# Patient Record
Sex: Male | Born: 1985 | Race: White | Hispanic: No | Marital: Single | Smoking: Former smoker
Health system: Southern US, Community
[De-identification: ages and names within clinical notes are randomized; demographics above are authoritative.]

---

## 2007-02-14 ENCOUNTER — Emergency Department (HOSPITAL_COMMUNITY): Admission: EM | Admit: 2007-02-14 | Discharge: 2007-02-14 | Payer: Self-pay | Admitting: Emergency Medicine

## 2018-05-26 ENCOUNTER — Ambulatory Visit (HOSPITAL_COMMUNITY): Payer: Self-pay | Admitting: Licensed Clinical Social Worker

## 2018-09-07 ENCOUNTER — Telehealth: Payer: Self-pay | Admitting: Family

## 2018-09-07 DIAGNOSIS — R6889 Other general symptoms and signs: Principal | ICD-10-CM

## 2018-09-07 DIAGNOSIS — Z20822 Contact with and (suspected) exposure to covid-19: Secondary | ICD-10-CM

## 2018-09-07 MED ORDER — BENZONATATE 100 MG PO CAPS: 100.0000 mg | ORAL_CAPSULE | Freq: Three times a day (TID) | ORAL | 0 refills | Status: DC | PRN

## 2018-09-07 NOTE — Progress Notes (Signed)

## 2019-08-28 ENCOUNTER — Other Ambulatory Visit: Payer: Self-pay

## 2019-08-28 ENCOUNTER — Emergency Department (HOSPITAL_COMMUNITY): Payer: No Typology Code available for payment source

## 2019-08-28 ENCOUNTER — Emergency Department (HOSPITAL_COMMUNITY)
Admission: EM | Admit: 2019-08-28 | Discharge: 2019-08-28 | Disposition: A | Discharge status: Home / Self Care | Payer: No Typology Code available for payment source | Attending: Emergency Medicine | Admitting: Emergency Medicine

## 2019-08-28 DIAGNOSIS — Y939 Activity, unspecified: Secondary | ICD-10-CM | POA: Diagnosis not present

## 2019-08-28 DIAGNOSIS — Y929 Unspecified place or not applicable: Secondary | ICD-10-CM | POA: Insufficient documentation

## 2019-08-28 DIAGNOSIS — R519 Headache, unspecified: Secondary | ICD-10-CM | POA: Diagnosis present

## 2019-08-28 DIAGNOSIS — Y999 Unspecified external cause status: Secondary | ICD-10-CM | POA: Insufficient documentation

## 2019-08-28 DIAGNOSIS — M542 Cervicalgia: Secondary | ICD-10-CM

## 2019-08-28 DIAGNOSIS — S0101XA Laceration without foreign body of scalp, initial encounter: Secondary | ICD-10-CM | POA: Diagnosis not present

## 2019-08-28 DIAGNOSIS — S1081XA Abrasion of other specified part of neck, initial encounter: Secondary | ICD-10-CM | POA: Diagnosis not present

## 2019-08-28 DIAGNOSIS — S40012A Contusion of left shoulder, initial encounter: Secondary | ICD-10-CM | POA: Diagnosis not present

## 2019-08-28 DIAGNOSIS — M25512 Pain in left shoulder: Secondary | ICD-10-CM

## 2019-08-28 LAB — I-STAT CHEM 8, ED
BUN: 18 mg/dL (ref 6–20)
Calcium, Ion: 1.22 mmol/L (ref 1.15–1.40)
Chloride: 103 mmol/L (ref 98–111)
Creatinine, Ser: 1.2 mg/dL (ref 0.61–1.24)
Glucose, Bld: 98 mg/dL (ref 70–99)
HCT: 45 % (ref 39.0–52.0)
Hemoglobin: 15.3 g/dL (ref 13.0–17.0)
Potassium: 3.6 mmol/L (ref 3.5–5.1)
Sodium: 141 mmol/L (ref 135–145)
TCO2: 26 mmol/L (ref 22–32)

## 2019-08-28 MED ORDER — ACETAMINOPHEN 500 MG PO TABS
1000.0000 mg | ORAL_TABLET | Freq: Once | ORAL | Status: AC
  Administered 2019-08-28: 05:00:00 1000 mg via ORAL
  Filled 2019-08-28: qty 2

## 2019-08-28 MED ORDER — IOHEXOL 350 MG/ML SOLN
100.0000 mL | Freq: Once | INTRAVENOUS | Status: AC | PRN
  Administered 2019-08-28: 06:00:00 100 mL via INTRAVENOUS

## 2019-08-28 MED ORDER — METHOCARBAMOL 500 MG PO TABS: 500.0000 mg | ORAL_TABLET | Freq: Two times a day (BID) | ORAL | 0 refills | Status: DC | PRN

## 2019-08-28 MED ORDER — LIDOCAINE-EPINEPHRINE (PF) 2 %-1:200000 IJ SOLN
10.0000 mL | Freq: Once | INTRAMUSCULAR | Status: DC
  Filled 2019-08-28: qty 20
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
## 2019-08-28 NOTE — ED Notes (Signed)
Pt d/c home per MD order. Discharge summary reviewed with pt, pt verbalizes understanding. No s/s of acute distress noted, ambulatory, reports discharge ride home.
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
## 2019-08-28 NOTE — ED Notes (Signed)
c-collar placed on patient.

## 2019-08-28 NOTE — ED Notes (Signed)
Pt to ct 

## 2019-08-28 NOTE — Discharge Instructions (Signed)
Take ibuprofen 3 times a day with meals.  Do not take other anti-inflammatories at the same time (Advil, Motrin, naproxen, Aleve). You may supplement with Tylenol if you need further pain control. °Use robaxin as needed for muscle stiffness or soreness.  Have caution, this may make you tired or groggy.  Do not drive or operate heavy machinery while taking this medicine. °Use ice packs or heating pads if this helps control your pain. °You will likely have continued muscle stiffness and soreness over the next couple days.  Follow-up with primary care in 1 week if your symptoms are not improving. °Return to the emergency room if you develop vision changes, vomiting, slurred speech, numbness, loss of bowel or bladder control, or any new or worsening symptoms. ° °
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
## 2021-01-12 IMAGING — CT CT CERVICAL SPINE W/O CM
3 of 4 series · 12 of 33 positions shown, 14 images · non-contrast
Comparison: None.

CLINICAL DATA: Head trauma, laceration right side of head and
posterior neck

EXAM:
CT HEAD WITHOUT CONTRAST
TECHNIQUE: Contiguous axial images were obtained from the base of the skull
through the vertex without intravenous contrast.

[Series 8: sag bone · sagittal · 0.47mm/px · 5 of 226 slices shown, 6 images]
[im 76/226  bone]
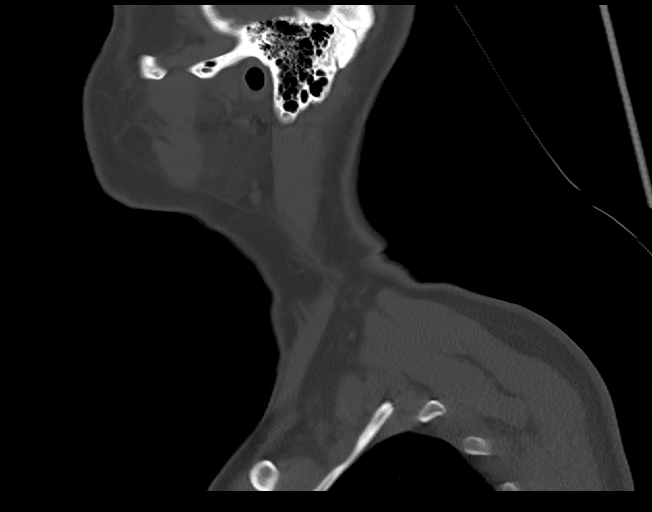
[im 94/226  bone]
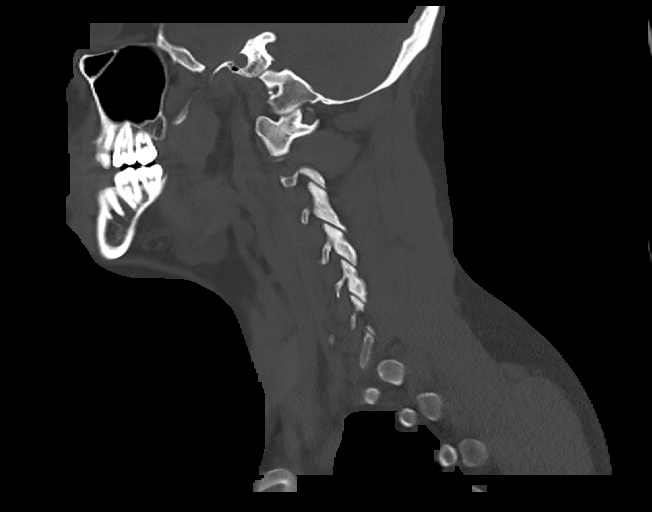
[im 113/226  soft-tissue]
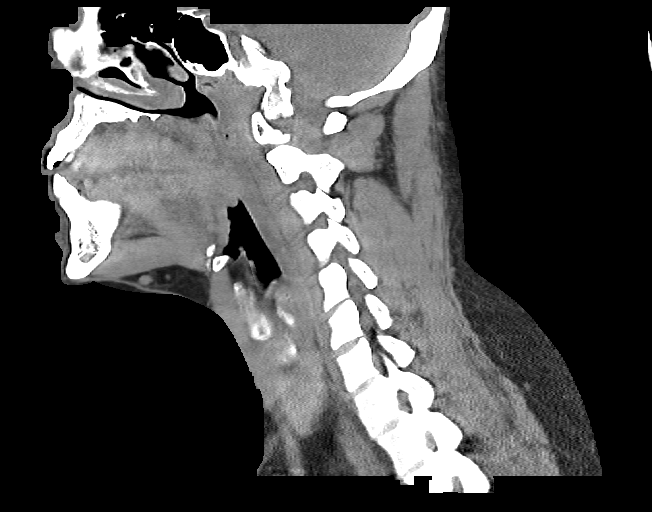
[im 113/226  bone]
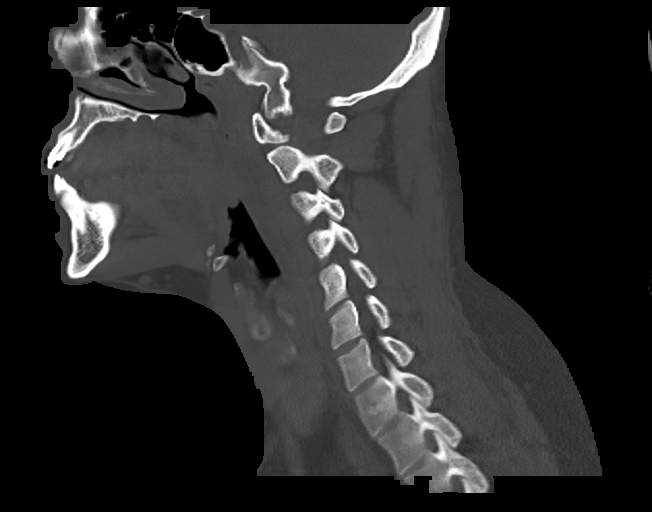
[im 132/226  bone]
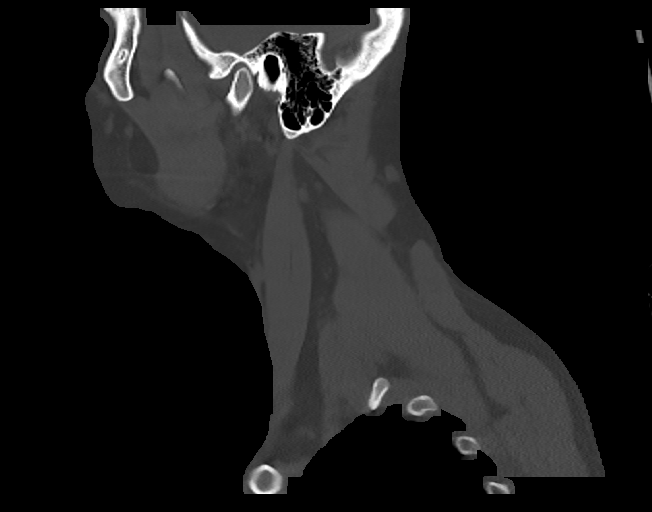
[im 151/226  bone]
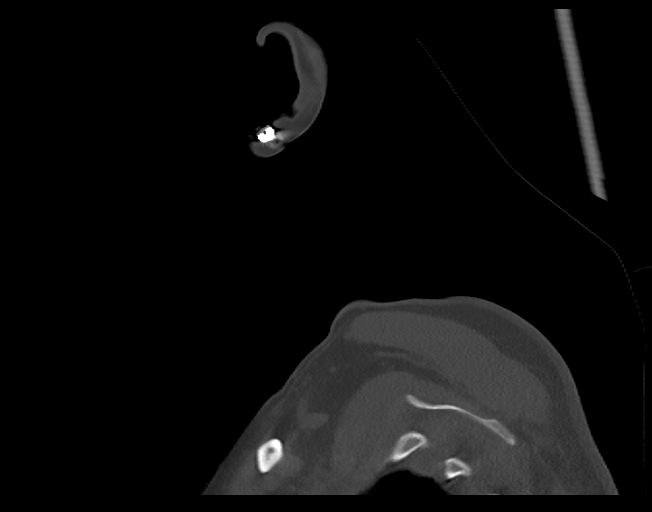

[Series 9: cor bone · coronal · 0.47mm/px · 3 of 150 slices shown]
[im 30/150  bone]
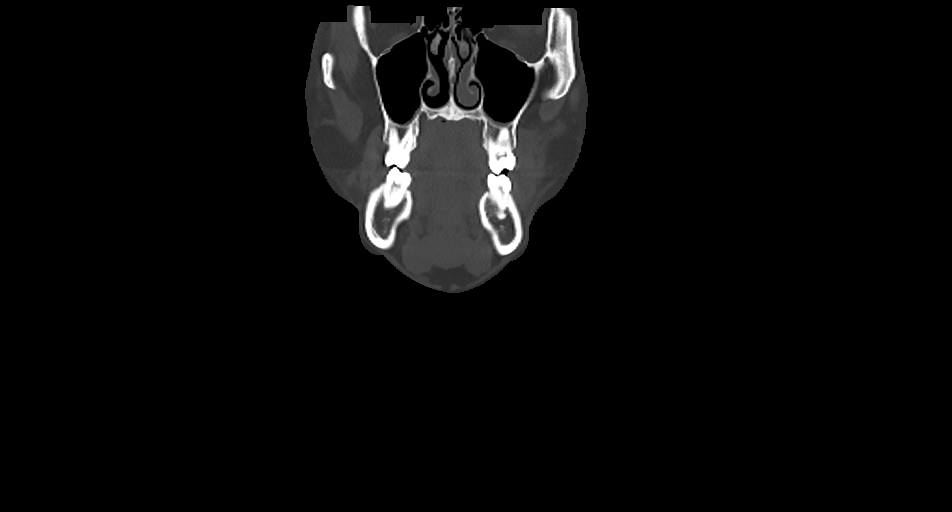
[im 60/150  bone]
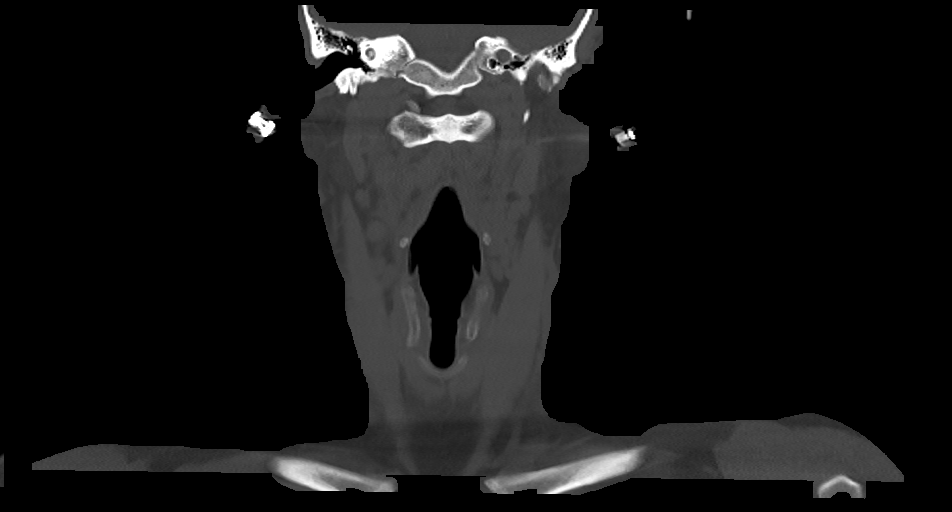
[im 90/150  bone]
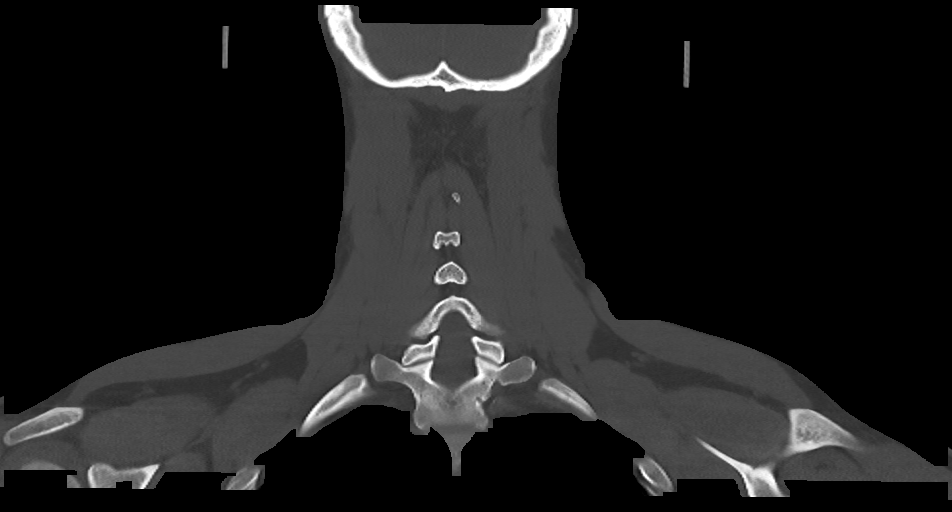

[Series 10: orthogonal axials · axial · 0.21mm/px · z∈[-293,-165]mm · 4 of 105 slices shown, 5 images]
[im 18/105  soft-tissue]
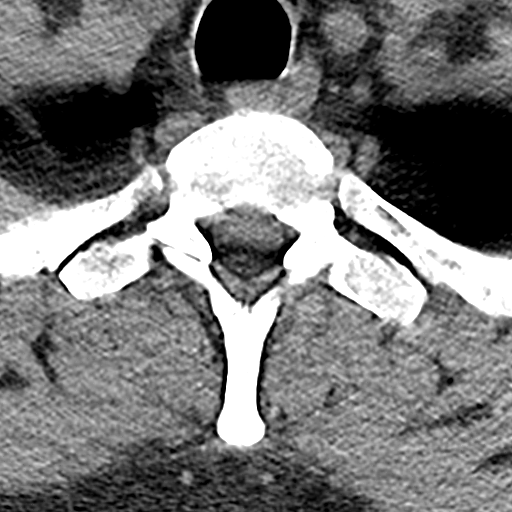
[im 18/105  bone]
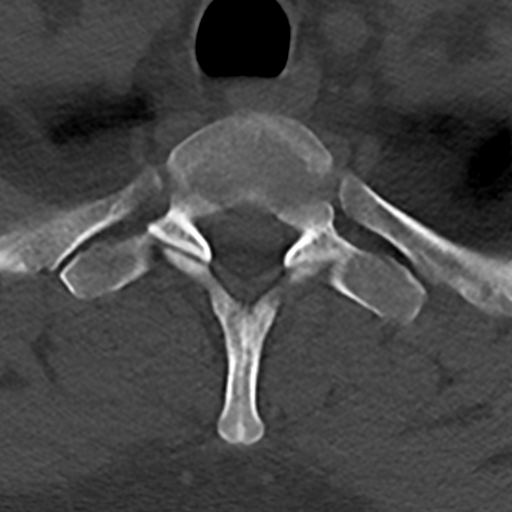
[im 35/105  bone]
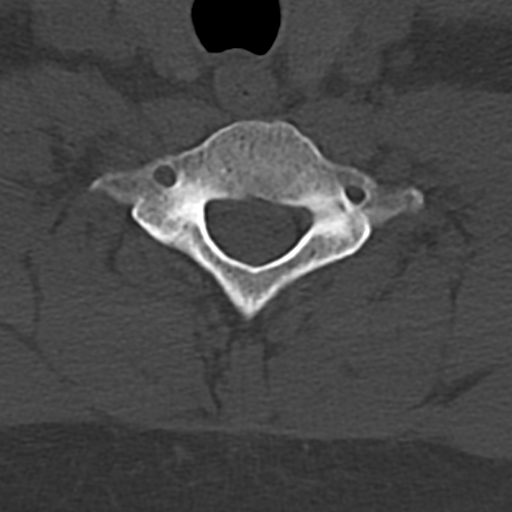
[im 70/105  bone]
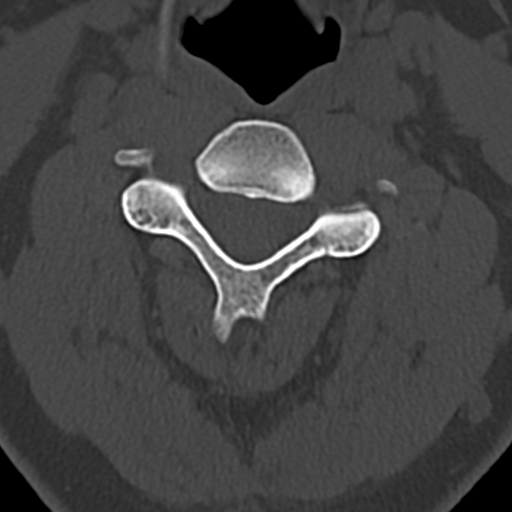
[im 87/105  bone]
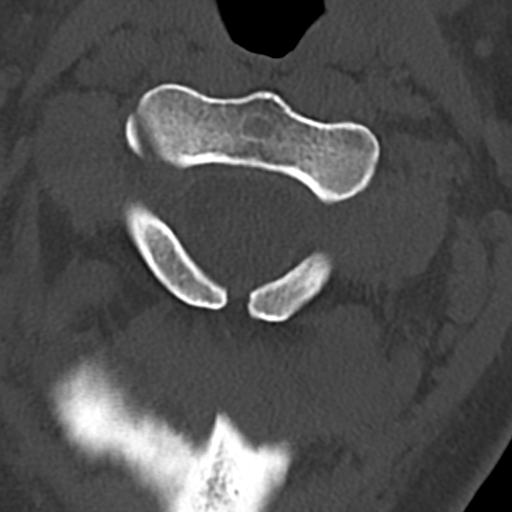

[12 of 33 positions shown; findings below may reference images not displayed]

FINDINGS: Brain: No evidence of acute territorial infarction, hemorrhage,
hydrocephalus,extra-axial collection or mass lesion/mass effect.
Normal gray-white differentiation. Ventricles are normal in size and
contour.

Vascular: No hyperdense vessel or unexpected calcification.

Skull: The skull is intact. No fracture or focal lesion identified.

Sinuses/Orbits: The visualized paranasal sinuses and mastoid air
cells are clear. The orbits and globes intact.

Other: None

Cervical spine:

Alignment: Physiologic

Skull base and vertebrae: Visualized skull base is intact. No
atlanto-occipital dissociation. The vertebral body heights are well
maintained. No fracture or pathologic osseous lesion seen.

Soft tissues and spinal canal: The visualized paraspinal soft
tissues are unremarkable. No prevertebral soft tissue swelling is
seen. The spinal canal is grossly unremarkable, no large epidural
collection or significant canal narrowing.

Disc levels: No significant canal or neural foraminal narrowing is
seen.

Upper chest: The lung apices are clear. Thoracic inlet is within
normal limits.

Other: None
IMPRESSION: No acute intracranial abnormality.

No acute fracture or malalignment of the spine.

## 2023-06-09 ENCOUNTER — Encounter (HOSPITAL_COMMUNITY): Payer: Self-pay

## 2023-06-09 ENCOUNTER — Ambulatory Visit (HOSPITAL_COMMUNITY)
Admission: RE | Admit: 2023-06-09 | Discharge: 2023-06-09 | Disposition: A | Discharge status: Home / Self Care | Payer: Commercial Managed Care - PPO | Source: Ambulatory Visit | Attending: Family Medicine | Admitting: Family Medicine

## 2023-06-09 ENCOUNTER — Ambulatory Visit (INDEPENDENT_AMBULATORY_CARE_PROVIDER_SITE_OTHER): Payer: Commercial Managed Care - PPO

## 2023-06-09 VITALS — BP 131/84 | HR 91 | Temp 97.8°F | Resp 18

## 2023-06-09 DIAGNOSIS — M25422 Effusion, left elbow: Secondary | ICD-10-CM

## 2023-06-09 DIAGNOSIS — M7989 Other specified soft tissue disorders: Secondary | ICD-10-CM | POA: Diagnosis not present

## 2023-06-09 DIAGNOSIS — M7022 Olecranon bursitis, left elbow: Secondary | ICD-10-CM | POA: Diagnosis not present

## 2023-06-09 NOTE — Discharge Instructions (Signed)
By my review, your x-rays are normal.  The radiologist will also read your x-ray, and if their interpretation differs significantly from mine, we will call you.  Please call the orthopedist office to be evaluated.  They do have an orthopedic urgent care type set up at their [facility] office.

## 2023-06-09 NOTE — ED Provider Notes (Signed)
[facility]    CSN: [id number] Arrival date & time: 06/09/23  1301      History   Chief Complaint Chief Complaint  Patient presents with   Cyst    HPI Juan Whitney is a 38 y.o. male.   HPI Here for swelling on the dorsal aspect of his left elbow.  There is no pain there.  He expresses concern that something serious is going on because he can feel more prominence to the olecranon process on his left elbow than on his right elbow.  Again it is absolutely not painful.  There is no fever or rash.  He has never had this happen before  No trauma.  His significant other in the room expresses concern to staff that he might have gout. History reviewed. No pertinent past medical history.  There are no active problems to display for this patient.   History reviewed. No pertinent surgical history.     Home Medications    Prior to Admission medications   Not on File    Family History Family History  Problem Relation Age of Onset   Ovarian cancer Mother    Diabetes Father     Social History Social History   Tobacco Use   Smoking status: Former    Types: Cigarettes   Smokeless tobacco: Current    Types: Chew  Vaping Use   Vaping status: Never Used  Substance Use Topics   Alcohol use: Yes   Drug use: Never     Allergies   Codeine and Penicillins   Review of Systems Review of Systems   Physical Exam Triage Vital Signs ED Triage Vitals  Encounter Vitals Group     BP 06/09/23 1329 131/84     Systolic BP Percentile --      Diastolic BP Percentile --      Pulse Rate 06/09/23 1329 91     Resp 06/09/23 1329 18     Temp 06/09/23 1329 97.8 F (36.6 C)     Temp Source 06/09/23 1329 Oral     SpO2 06/09/23 1329 95 %     Weight --      Height --      Head Circumference --      Peak Flow --      Pain Score 06/09/23 1326 0     Pain Loc --      Pain Education --      Exclude from Growth Chart --    No data found.  Updated Vital Signs BP  131/84 (BP Location: Right Arm)   Pulse 91   Temp 97.8 F (36.6 C) (Oral)   Resp 18   SpO2 95%   Visual Acuity Right Eye Distance:   Left Eye Distance:   Bilateral Distance:    Right Eye Near:   Left Eye Near:    Bilateral Near:     Physical Exam Vitals reviewed.  Constitutional:      General: He is not in acute distress.    Appearance: He is not toxic-appearing.  Musculoskeletal:     Comments: There is a mild swelling of the olecranon bursa on the left elbow.  I do not feel any worrisome nodules or masses.  The swelling is not red or hot.  There is no skin induration.  Skin:    Coloration: Skin is not jaundiced or pale.  Neurological:     General: No focal deficit present.     Mental Status: He is  alert and oriented to person, place, and time.  Psychiatric:        Behavior: Behavior normal.      UC Treatments / Results  Labs (all labs ordered are listed, but only abnormal results are displayed) Labs Reviewed - No data to display  EKG   Radiology No results found.  Procedures Procedures (including critical care time)  Medications Ordered in UC Medications - No data to display  Initial Impression / Assessment and Plan / UC Course  I have reviewed the triage vital signs and the nursing notes.  Pertinent labs & imaging results that were available during my care of the patient were reviewed by me and considered in my medical decision making (see chart for details).     X-ray is ordered due to the expressed concern.  I do not think this patient needs evaluation for gout, with no pain being present.  X-rays are normal by my review.  He is advised of radiology over read  I think this is an olecranon bursitis.  Since the swelling is very mild I do not think he needs arthrocentesis at this time either.  He is given contact information for orthopedics  Final Clinical Impressions(s) / UC Diagnoses   Final diagnoses:  Elbow swelling, left  Olecranon bursitis  of left elbow     Discharge Instructions      By my review, your x-rays are normal.  The radiologist will also read your x-ray, and if their interpretation differs significantly from mine, we will call you.  Please call the orthopedist office to be evaluated.  They do have an orthopedic urgent care type set up at their [facility] office.      ED Prescriptions   None    PDMP not reviewed this encounter.   Zenia Resides, MD 06/09/23 [phone]

## 2023-06-09 NOTE — ED Triage Notes (Signed)
Pt states that he noticed a knot on his left elbow Friday. He denies any pain.

## 2023-06-12 DIAGNOSIS — M7022 Olecranon bursitis, left elbow: Secondary | ICD-10-CM | POA: Diagnosis not present
